# Patient Record
Sex: Female | Born: 1975 | Race: White | Hispanic: No | Marital: Single | Smoking: Never smoker
Health system: Southern US, Community
[De-identification: ages and names within clinical notes are randomized; demographics above are authoritative.]

## PROBLEM LIST (undated history)

## (undated) HISTORY — PX: BUNIONECTOMY: SHX129

---
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
## 2005-06-10 ENCOUNTER — Other Ambulatory Visit: Admission: RE | Admit: 2005-06-10 | Discharge: 2005-06-10 | Payer: Self-pay | Admitting: Obstetrics and Gynecology

## 2005-11-10 ENCOUNTER — Emergency Department (HOSPITAL_COMMUNITY): Admission: EM | Admit: 2005-11-10 | Discharge: 2005-11-10 | Payer: Self-pay | Admitting: Emergency Medicine

## 2006-06-16 ENCOUNTER — Other Ambulatory Visit: Admission: RE | Admit: 2006-06-16 | Discharge: 2006-06-16 | Payer: Self-pay | Admitting: Obstetrics and Gynecology

## 2006-07-06 ENCOUNTER — Ambulatory Visit (HOSPITAL_COMMUNITY): Admission: RE | Admit: 2006-07-06 | Discharge: 2006-07-06 | Payer: Self-pay | Admitting: Chiropractic Medicine

## 2007-07-08 ENCOUNTER — Other Ambulatory Visit: Admission: RE | Admit: 2007-07-08 | Discharge: 2007-07-08 | Payer: Self-pay | Admitting: Obstetrics and Gynecology

## 2007-07-23 IMAGING — CR DG CERVICAL SPINE COMPLETE 4+V
6 series · 6 of 6 positions shown · non-contrast
Comparison: None.

CLINICAL DATA: Neck pain for several months.  Numbness in left hand.  
 CERVICAL SPINE ? 4 VIEW:

[w c-spine lat]
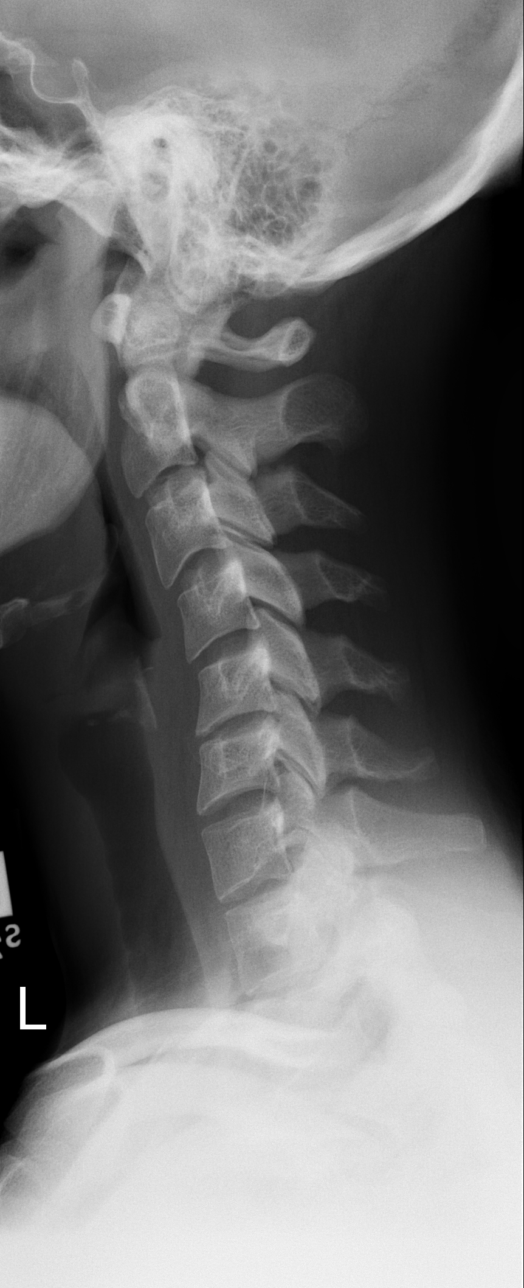

[w c-spine oblique (1 of 2)]
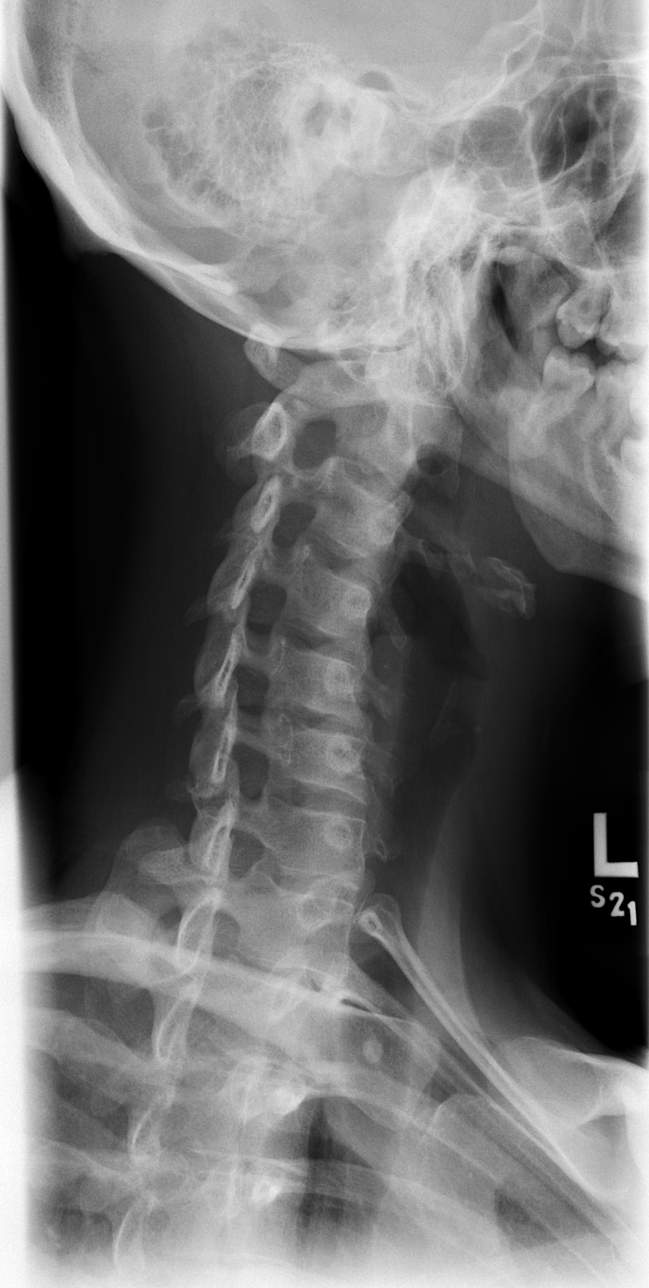

[w c-spine oblique (2 of 2)]
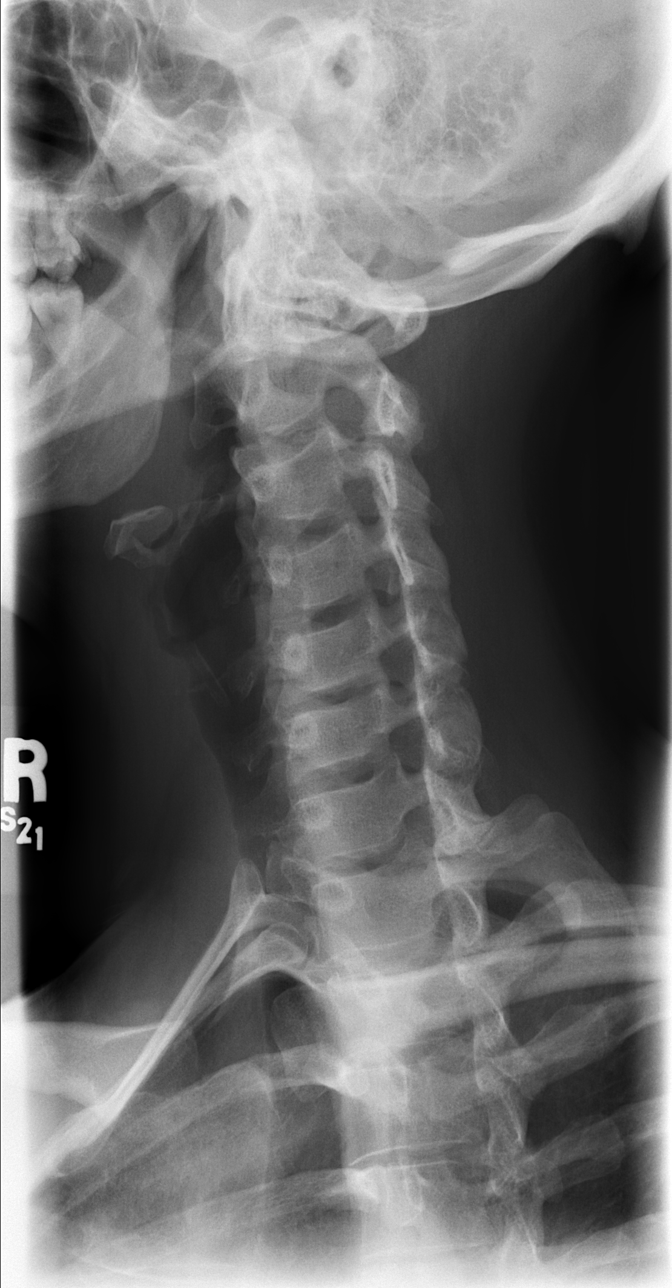

[w c-spine a.p.]
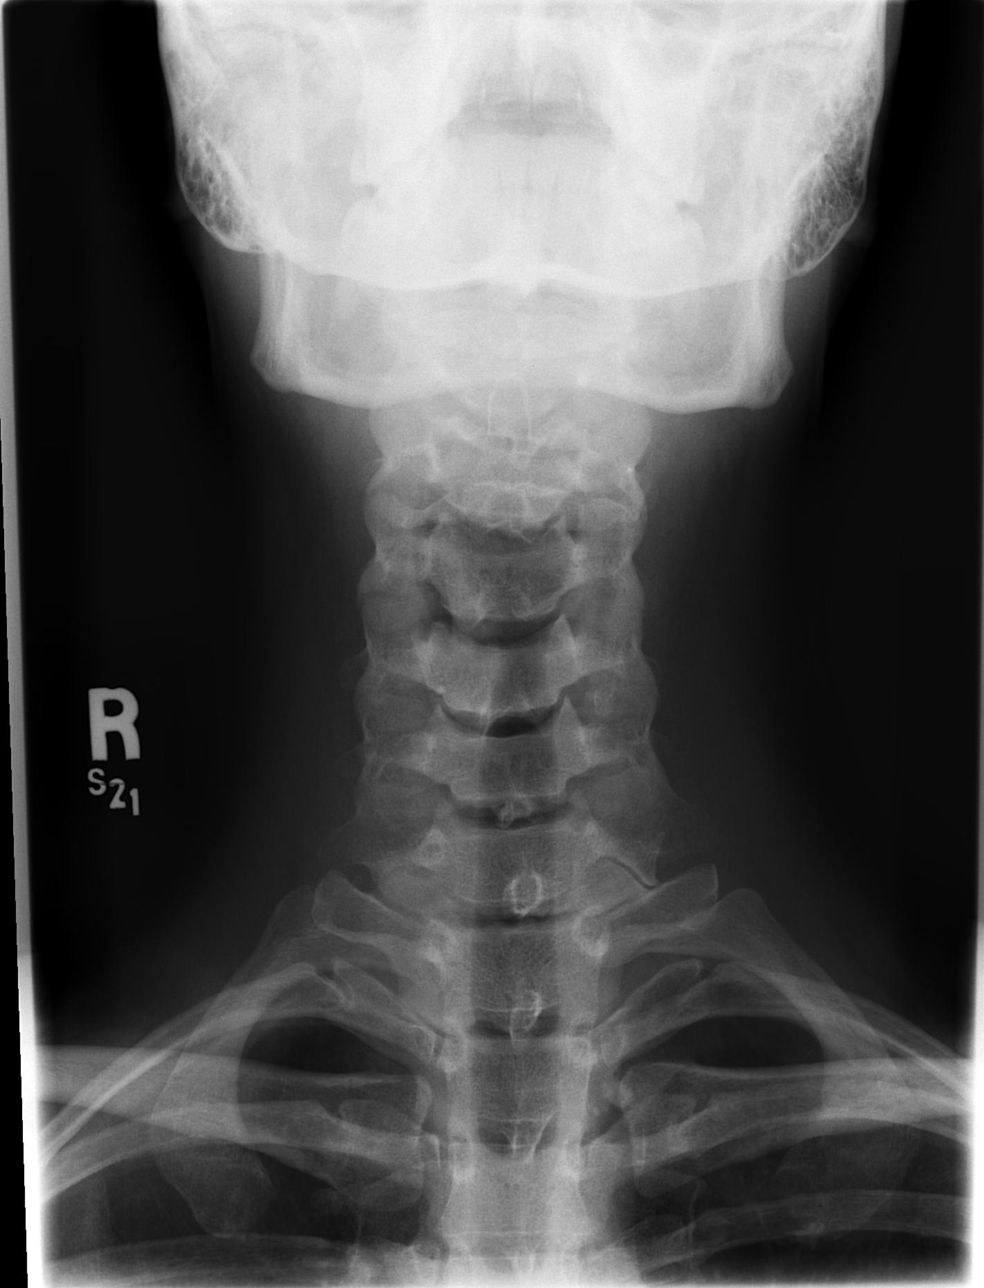

[w c-spine odontoid]
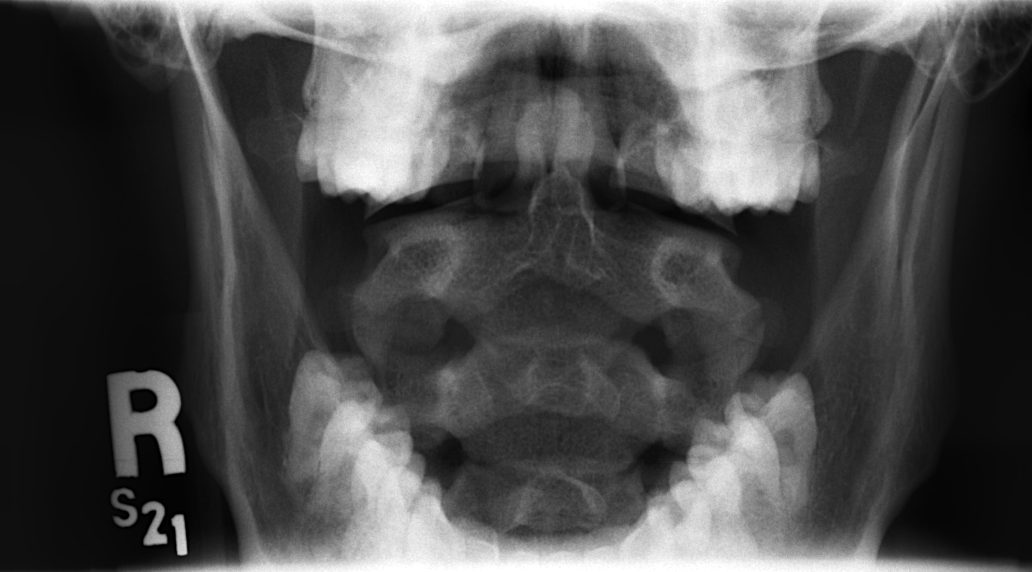

[w c-spine odontoid *]
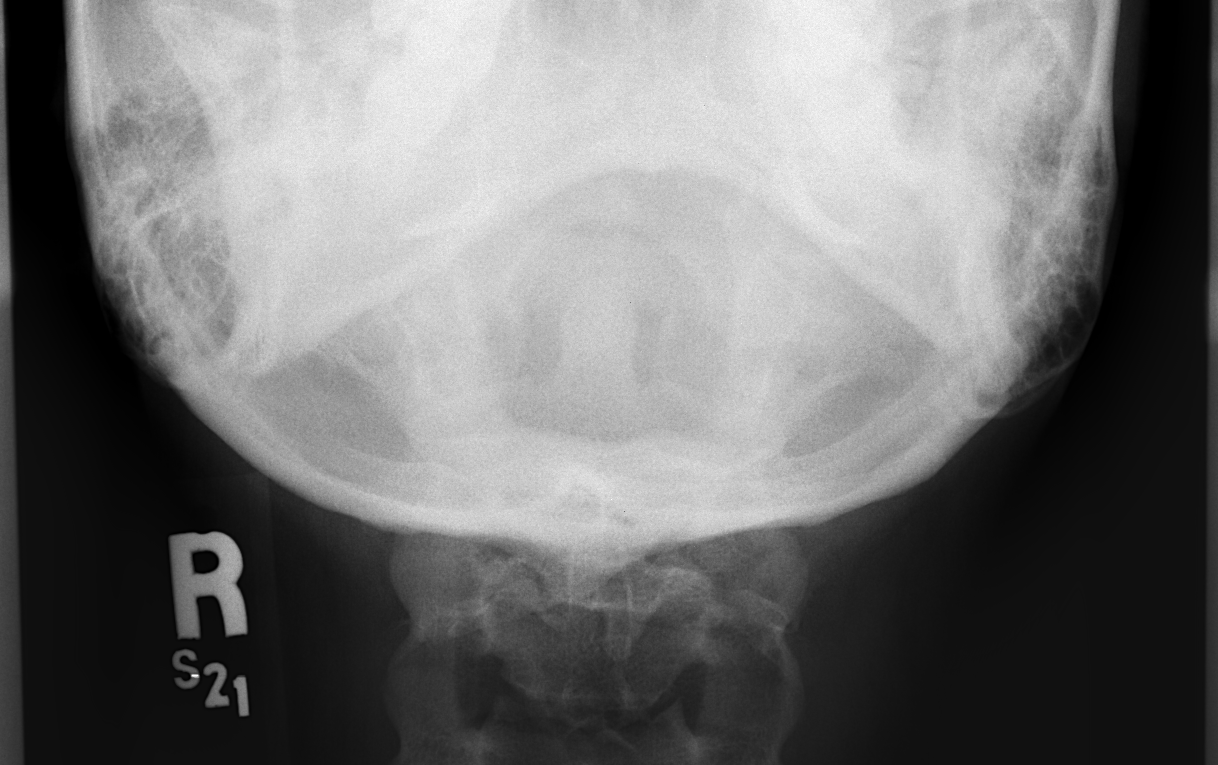

[6 of 6 positions shown; findings below may reference images not displayed]

FINDINGS: There is reversal of the normal cervical lordosis.  There is disc narrowing at C5-6.  No foraminal stenosis.  No fractures or subluxations. 
 Prevertebral soft tissues are normal.
IMPRESSION: 1.  Degenerative disc disease at C5-6.
 2.  Reversal of the normal cervical lordosis.  
 3.  No acute findings otherwise.

## 2012-06-28 ENCOUNTER — Ambulatory Visit (INDEPENDENT_AMBULATORY_CARE_PROVIDER_SITE_OTHER): Payer: BC Managed Care – PPO | Admitting: Physician Assistant

## 2012-06-28 VITALS — BP 112/80 | HR 68 | Temp 98.2°F | Resp 16 | Ht 68.0 in | Wt 155.0 lb

## 2012-06-28 DIAGNOSIS — J029 Acute pharyngitis, unspecified: Secondary | ICD-10-CM

## 2012-06-28 LAB — POCT CBC
HCT, POC: 45.7 % (ref 37.7–47.9)
Hemoglobin: 14.7 g/dL (ref 12.2–16.2)
MCH, POC: 30.1 pg (ref 27–31.2)
MCHC: 32.2 g/dL (ref 31.8–35.4)
MID (cbc): 0.7 (ref 0–0.9)
POC MID %: 9.1 %M (ref 0–12)

## 2012-06-28 MED ORDER — FIRST-DUKES MOUTHWASH MT SUSP: 10.0000 mL | OROMUCOSAL | Status: DC | PRN

## 2012-06-28 NOTE — Patient Instructions (Addendum)
Your labs today are reassure.  I suspect your sore throat is due to a virus.  Ibuprofen 600mg  every 8 hours with food for pain relief.  Magic Mouthwash every 2 hours if needed.  Drink plenty of fluids (water is best!)  Rest when possible.  Good hand hygiene to prevent the spread of germs.  Please let me know if you are worsening or not improving, specifically if you develop fever >101.11F, worsening pain, or difficulty swallowing/breathing.  I will let you know when your culture results are back and if we need to make any changes.   Viral Pharyngitis Viral pharyngitis is a viral infection that produces redness, pain, and swelling (inflammation) of the throat. It can spread from person to person (contagious). CAUSES Viral pharyngitis is caused by inhaling a large amount of certain germs called viruses. Many different viruses cause viral pharyngitis. SYMPTOMS Symptoms of viral pharyngitis include:  Sore throat.  Tiredness.  Stuffy nose.  Low-grade fever.  Congestion.  Cough. TREATMENT Treatment includes rest, drinking plenty of fluids, and the use of over-the-counter medication (approved by your caregiver). HOME CARE INSTRUCTIONS   Drink enough fluids to keep your urine clear or pale yellow.  Eat soft, cold foods such as ice cream, frozen ice pops, or gelatin dessert.  Gargle with warm salt water (1 tsp salt per 1 qt of water).  If over age 44, throat lozenges may be used safely.  Only take over-the-counter or prescription medicines for pain, discomfort, or fever as directed by your caregiver. Do not take aspirin. To help prevent spreading viral pharyngitis to others, avoid:  Mouth-to-mouth contact with others.  Sharing utensils for eating and drinking.  Coughing around others. SEEK MEDICAL CARE IF:   You are better in a few days, then become worse.  You have a fever or pain not helped by pain medicines.  There are any other changes that concern you. Document Released:  01/14/2005 Document Revised: 06/29/2011 Document Reviewed: 06/12/2010 [facility] Patient Information 2013 [place], [place].

## 2012-06-28 NOTE — Progress Notes (Signed)
Subjective:    Patient ID: Shannon Mcneil, female    DOB: 08-20-1975, 37 y.o.   MRN: [id number]  HPI   Shannon Mcneil is a pleasant 37 yr old female here with concern for illness.  Started with a sore throat yesterday afternoon.  Felt like this was more severe than usual and was concerning to her.  Has had a slight headache and maybe some back pain.  No fever or chills.  No runny nose or post-nasal drainage.  No cough.  No ear pain.  Endorses a little nausea, appetite maybe a little decreased.  She may have had contact with a sick coworker last week.  Did have strep throat about 3 yrs ago.     Review of Systems  Constitutional: Positive for appetite change (slightly decreased). Negative for fever and chills.  HENT: Positive for sore throat. Negative for ear pain, congestion, rhinorrhea, postnasal drip and sinus pressure.   Respiratory: Negative for cough, shortness of breath and wheezing.   Cardiovascular: Negative.   Gastrointestinal: Negative.   Musculoskeletal: Positive for back pain.  Skin: Negative.   Neurological: Positive for headaches.       Objective:   Physical Exam  Vitals reviewed. Constitutional: She is oriented to person, place, and time. She appears well-developed and well-nourished. No distress.  HENT:  Head: Normocephalic and atraumatic.  Right Ear: Tympanic membrane and ear canal normal.  Left Ear: Tympanic membrane and ear canal normal.  Nose: Right sinus exhibits no maxillary sinus tenderness and no frontal sinus tenderness. Left sinus exhibits no maxillary sinus tenderness and no frontal sinus tenderness.  Mouth/Throat: Uvula is midline and mucous membranes are normal. Posterior oropharyngeal erythema present. No oropharyngeal exudate, posterior oropharyngeal edema or tonsillar abscesses.  Eyes: Conjunctivae are normal. No scleral icterus.  Neck: Neck supple.  Cardiovascular: Normal rate, regular rhythm and normal heart sounds.  Exam reveals no gallop and no friction rub.    No murmur heard. Pulmonary/Chest: Effort normal and breath sounds normal. She has no wheezes. She has no rales.  Lymphadenopathy:    She has no cervical adenopathy.  Neurological: She is alert and oriented to person, place, and time.  Skin: Skin is warm and dry.  Psychiatric: She has a normal mood and affect. Her behavior is normal.     Filed Vitals:   06/28/12 1907  BP: 112/80  Pulse: 68  Temp: 98.2 F (36.8 C)  Resp: 16     Results for orders placed in visit on 06/28/12  POCT CBC      Result Value Range   WBC 7.3  4.6 - 10.2 K/uL   Lymph, poc 2.0  0.6 - 3.4   POC LYMPH PERCENT 26.9  10 - 50 %L   MID (cbc) 0.7  0 - 0.9   POC MID % 9.1  0 - 12 %M   POC Granulocyte 4.7  2 - 6.9   Granulocyte percent 64.0  37 - 80 %G   RBC 4.89  4.04 - 5.48 M/uL   Hemoglobin 14.7  12.2 - 16.2 g/dL   HCT, POC 40.9  81.1 - 47.9 %   MCV 93.5  80 - 97 fL   MCH, POC 30.1  27 - 31.2 pg   MCHC 32.2  31.8 - 35.4 g/dL   RDW, POC 91.4     Platelet Count, POC 319  142 - 424 K/uL   MPV 8.5  0 - 99.8 fL  POCT RAPID STREP A (OFFICE)  Result Value Range   Rapid Strep A Screen Negative  Negative       Assessment & Plan:  Acute pharyngitis - Plan: POCT CBC, POCT rapid strep A, Culture, Group A Strep, Diphenhyd-Hydrocort-Nystatin (FIRST-DUKES MOUTHWASH) SUSP   Shannon Mcneil is a very pleasant 37 yr old female with pharyngitis.  Rapid strep is negative, CBC is normal.  Suspect viral etiology.  Throat culture sent.  Ibuprofen and magic mouthwash for symptomatic relief.  Push fluids.  Discussed good hand hygiene to prevent the spread of disease.  Discussed RTC precautions, specifically fever, worsening pain, difficulty swallowing/breathing.  Will follow-up on culture results and treat if necessary.

## 2012-07-01 LAB — CULTURE, GROUP A STREP

## 2013-04-06 ENCOUNTER — Telehealth (INDEPENDENT_AMBULATORY_CARE_PROVIDER_SITE_OTHER): Payer: Self-pay

## 2013-04-06 NOTE — Telephone Encounter (Signed)
Called and left message for patient o call our office.  RE:  Need clarification on appointment.  Patient has been scheduled to see Dr. Johna Sheriff on 04/07/13 @ 1:30 pm and also has appointment to see Dr. Derrell Lolling on 04/25/13 @ 9:45 am.  Need to know which appointment the patient would like to keep.

## 2013-04-07 ENCOUNTER — Ambulatory Visit (INDEPENDENT_AMBULATORY_CARE_PROVIDER_SITE_OTHER): Payer: BC Managed Care – PPO | Admitting: General Surgery

## 2013-04-25 ENCOUNTER — Encounter (INDEPENDENT_AMBULATORY_CARE_PROVIDER_SITE_OTHER): Payer: Self-pay | Admitting: General Surgery

## 2013-04-25 ENCOUNTER — Encounter (INDEPENDENT_AMBULATORY_CARE_PROVIDER_SITE_OTHER): Payer: Self-pay

## 2013-04-25 ENCOUNTER — Ambulatory Visit (INDEPENDENT_AMBULATORY_CARE_PROVIDER_SITE_OTHER): Payer: BC Managed Care – PPO | Admitting: General Surgery

## 2013-04-25 VITALS — BP 118/72 | HR 64 | Temp 97.8°F | Resp 14 | Ht 67.5 in | Wt 155.6 lb

## 2013-04-25 DIAGNOSIS — N6012 Diffuse cystic mastopathy of left breast: Secondary | ICD-10-CM

## 2013-04-25 DIAGNOSIS — N6019 Diffuse cystic mastopathy of unspecified breast: Secondary | ICD-10-CM

## 2013-04-25 NOTE — Patient Instructions (Signed)
Your physical exam is reassuring. It shows normal glandular lumpiness in the upper outer quadrant of both breasts, somewhat more prominent on the left. I do not feel a dominant mass. Given that her imaging studies are normal, I feel that this is a low risk finding.  This is probably fibrocystic changes in your breast,  and is probably normal for your age.  I do not advise a biopsy at this time.  Obtain mammograms in June of 2015, and return to see Dr. Derrell LollingIngram for a physical exam in July, 2015.

## 2013-04-25 NOTE — Progress Notes (Signed)
Patient ID: Shannon Mcneil, female   DOB: 12-Feb-1976, 38 y.o.   MRN: [id number]  Chief Complaint  Patient presents with  . New Evaluation    left breast mass    HPI Shannon Mcneil is a 38 y.o. female.  She is referred by Lynden Ang,  nurse practitioner at [facility] for possible left breast mass, upper outer quadrant.  The patient noticed some prominent lumpiness in her left breast, upper outer quadrant in May of 2014. No skin change. No nipple discharge. No trauma.She was examined in June and had mammograms and ultrasounds in June of 2014. Mammograms and ultrasound were negative. She thinks that the lumpiness is about the same, if anything perhaps smaller. It feels a little stiff but no severe pain. She has no prior history of breast problems.  Menarche age 69. Nulliparous. Single.Has been on birth control pills for many years.  Family history is negative for breast or ovarian cancer.  HPI  History reviewed. No pertinent past medical history.  Past Surgical History  Procedure Laterality Date  . Wisdom tooth extraction  1995  . Bunionectomy      Family History  Problem Relation Age of Onset  . Stroke Mother     Social History History  Substance Use Topics  . Smoking status: Never Smoker   . Smokeless tobacco: Never Used  . Alcohol Use: 3.6 oz/week    6 Glasses of wine per week    No Known Allergies  Current Outpatient Prescriptions  Medication Sig Dispense Refill  . etonogestrel (NEXPLANON) 68 MG IMPL implant Inject 1 each into the skin once.       No current facility-administered medications for this visit.    Review of Systems Review of Systems  Constitutional: Negative for fever, chills and unexpected weight change.  HENT: Negative for congestion, hearing loss, sore throat, trouble swallowing and voice change.   Eyes: Negative for visual disturbance.  Respiratory: Negative for cough and wheezing.   Cardiovascular: Negative for chest pain, palpitations and  leg swelling.  Gastrointestinal: Negative for nausea, vomiting, abdominal pain, diarrhea, constipation, blood in stool, abdominal distention and anal bleeding.  Genitourinary: Negative for hematuria, vaginal bleeding and difficulty urinating.  Musculoskeletal: Negative for arthralgias.  Skin: Negative for rash and wound.  Neurological: Negative for seizures, syncope and headaches.  Hematological: Negative for adenopathy. Does not bruise/bleed easily.  Psychiatric/Behavioral: Negative for confusion.    Blood pressure 118/72, pulse 64, temperature 97.8 F (36.6 C), resp. rate 14, height 5' 7.5" (1.715 m), weight 155 lb 9.6 oz (70.58 kg).  Physical Exam Physical Exam  Constitutional: She is oriented to person, place, and time. She appears well-developed and well-nourished. No distress.  HENT:  Head: Normocephalic and atraumatic.  Nose: Nose normal.  Mouth/Throat: No oropharyngeal exudate.  Eyes: Conjunctivae and EOM are normal. Pupils are equal, round, and reactive to light. Left eye exhibits no discharge. No scleral icterus.  Cardiovascular: Normal rate, regular rhythm, normal heart sounds and intact distal pulses.   No murmur heard. Pulmonary/Chest: Effort normal and breath sounds normal. No respiratory distress. She has no wheezes. She has no rales. She exhibits no tenderness.  Breasts are medium sized. Soft. No skin change. Nipple and areola complex is normal. She has some benign appearing lumpiness in the upper outer quadrant of both breasts that simply feels like glandular breast tissue or fibrocystic change. Slightly more prominent on the left.There is no dominant mass. There is no axillary adenopathy.  Abdominal: Soft. Bowel sounds are normal.  She exhibits no distension and no mass. There is no tenderness. There is no rebound and no guarding.  Musculoskeletal: She exhibits no edema and no tenderness.  Neurological: She is alert and oriented to person, place, and time. She exhibits  normal muscle tone. Coordination normal.  Skin: Skin is warm. No rash noted. She is not diaphoretic. No erythema. No pallor.  Psychiatric: She has a normal mood and affect. Her behavior is normal. Judgment and thought content normal.    [facility]. Imaging studies from June 2014.  Assessment    Essentially normal breast exam for age. Possible superimposed prominent glandular breast tissue or fibrocystic change. Low risk finding     Plan    She was reassured of my impression. She seemed comfortable with my advice.  Repeat bilateral mammograms in June, 2015.  Return to see me in July 2015. If exam and imaging studies remained stable, and probably she can forgo further imaging studies until age 38.        Angelia MouldHaywood M. Derrell LollingIngram, M.D., [facility] General and Minimally invasive Surgery Breast and Colorectal Surgery Office:   [phone] Pager:   [phone]  04/25/2013, 10:23 AM

## 2013-04-26 ENCOUNTER — Encounter (INDEPENDENT_AMBULATORY_CARE_PROVIDER_SITE_OTHER): Payer: Self-pay

## 2013-10-11 ENCOUNTER — Encounter (INDEPENDENT_AMBULATORY_CARE_PROVIDER_SITE_OTHER): Payer: Self-pay | Admitting: General Surgery
